# Patient Record
Sex: Female | Born: 1970 | Race: White | Hispanic: No | Marital: Married | Smoking: Former smoker
Health system: Southern US, Community
[De-identification: ages and names within clinical notes are randomized; demographics above are authoritative.]

## PROBLEM LIST (undated history)

## (undated) DIAGNOSIS — Z1509 Genetic susceptibility to other malignant neoplasm: Secondary | ICD-10-CM

## (undated) DIAGNOSIS — Z1501 Genetic susceptibility to malignant neoplasm of breast: Secondary | ICD-10-CM

## (undated) DIAGNOSIS — Z808 Family history of malignant neoplasm of other organs or systems: Secondary | ICD-10-CM

## (undated) DIAGNOSIS — Z806 Family history of leukemia: Secondary | ICD-10-CM

## (undated) DIAGNOSIS — Z803 Family history of malignant neoplasm of breast: Secondary | ICD-10-CM

## (undated) HISTORY — DX: Genetic susceptibility to other malignant neoplasm: Z15.09

## (undated) HISTORY — DX: Family history of malignant neoplasm of breast: Z80.3

## (undated) HISTORY — DX: Genetic susceptibility to malignant neoplasm of breast: Z15.01

## (undated) HISTORY — DX: Family history of malignant neoplasm of other organs or systems: Z80.8

## (undated) HISTORY — DX: Family history of leukemia: Z80.6

---

## 1999-04-17 ENCOUNTER — Ambulatory Visit (HOSPITAL_COMMUNITY): Admission: RE | Admit: 1999-04-17 | Discharge: 1999-04-17 | Payer: Self-pay | Admitting: Obstetrics and Gynecology

## 2000-08-29 ENCOUNTER — Inpatient Hospital Stay (HOSPITAL_COMMUNITY): Admission: AD | Admit: 2000-08-29 | Discharge: 2000-08-29 | Payer: Self-pay | Admitting: Obstetrics and Gynecology

## 2002-03-06 ENCOUNTER — Other Ambulatory Visit: Admission: RE | Admit: 2002-03-06 | Discharge: 2002-03-06 | Payer: Self-pay | Admitting: Obstetrics and Gynecology

## 2002-07-30 ENCOUNTER — Ambulatory Visit (HOSPITAL_COMMUNITY): Admission: RE | Admit: 2002-07-30 | Discharge: 2002-07-30 | Payer: Self-pay | Admitting: Obstetrics and Gynecology

## 2002-07-30 ENCOUNTER — Encounter: Payer: Self-pay | Admitting: Obstetrics and Gynecology

## 2003-09-24 ENCOUNTER — Other Ambulatory Visit: Admission: RE | Admit: 2003-09-24 | Discharge: 2003-09-24 | Payer: Self-pay | Admitting: Obstetrics and Gynecology

## 2005-01-21 ENCOUNTER — Other Ambulatory Visit: Admission: RE | Admit: 2005-01-21 | Discharge: 2005-01-21 | Payer: Self-pay | Admitting: Obstetrics and Gynecology

## 2005-01-22 ENCOUNTER — Other Ambulatory Visit: Admission: RE | Admit: 2005-01-22 | Discharge: 2005-01-22 | Payer: Self-pay | Admitting: Obstetrics and Gynecology

## 2005-06-10 ENCOUNTER — Other Ambulatory Visit: Admission: RE | Admit: 2005-06-10 | Discharge: 2005-06-10 | Payer: Self-pay | Admitting: Obstetrics and Gynecology

## 2009-09-08 ENCOUNTER — Emergency Department (HOSPITAL_COMMUNITY): Admission: EM | Admit: 2009-09-08 | Discharge: 2009-09-08 | Payer: Self-pay | Admitting: Emergency Medicine

## 2017-07-18 ENCOUNTER — Emergency Department (HOSPITAL_COMMUNITY): Payer: Worker's Compensation

## 2017-07-18 ENCOUNTER — Emergency Department (HOSPITAL_COMMUNITY)
Admission: EM | Admit: 2017-07-18 | Discharge: 2017-07-18 | Disposition: A | Discharge status: Home / Self Care | Payer: Worker's Compensation | Attending: Emergency Medicine | Admitting: Emergency Medicine

## 2017-07-18 DIAGNOSIS — R52 Pain, unspecified: Secondary | ICD-10-CM

## 2017-07-18 DIAGNOSIS — Y999 Unspecified external cause status: Secondary | ICD-10-CM | POA: Diagnosis not present

## 2017-07-18 DIAGNOSIS — Y9289 Other specified places as the place of occurrence of the external cause: Secondary | ICD-10-CM | POA: Diagnosis not present

## 2017-07-18 DIAGNOSIS — Y9301 Activity, walking, marching and hiking: Secondary | ICD-10-CM | POA: Diagnosis not present

## 2017-07-18 DIAGNOSIS — R110 Nausea: Secondary | ICD-10-CM | POA: Insufficient documentation

## 2017-07-18 DIAGNOSIS — W1830XA Fall on same level, unspecified, initial encounter: Secondary | ICD-10-CM | POA: Insufficient documentation

## 2017-07-18 DIAGNOSIS — R55 Syncope and collapse: Secondary | ICD-10-CM | POA: Insufficient documentation

## 2017-07-18 DIAGNOSIS — S82851A Displaced trimalleolar fracture of right lower leg, initial encounter for closed fracture: Secondary | ICD-10-CM

## 2017-07-18 DIAGNOSIS — S99911A Unspecified injury of right ankle, initial encounter: Secondary | ICD-10-CM | POA: Diagnosis present

## 2017-07-18 LAB — CBG MONITORING, ED: Glucose-Capillary: 140 mg/dL — ABNORMAL HIGH (ref 65–99)

## 2017-07-18 MED ORDER — SODIUM CHLORIDE 0.9 % IV SOLN
INTRAVENOUS | Status: AC | PRN
  Administered 2017-07-18: 100 mL/h via INTRAVENOUS

## 2017-07-18 MED ORDER — ETOMIDATE 2 MG/ML IV SOLN
10.0000 mg | Freq: Once | INTRAVENOUS | Status: AC
  Administered 2017-07-18: 10 mg via INTRAVENOUS
  Filled 2017-07-18: qty 10

## 2017-07-18 MED ORDER — HYDROMORPHONE HCL 1 MG/ML IJ SOLN
1.0000 mg | Freq: Once | INTRAMUSCULAR | Status: AC
  Administered 2017-07-18: 1 mg via INTRAVENOUS
  Filled 2017-07-18: qty 1

## 2017-07-18 MED ORDER — SODIUM CHLORIDE 0.9 % IV BOLUS (SEPSIS)
500.0000 mL | Freq: Once | INTRAVENOUS | Status: AC
  Administered 2017-07-18: 1000 mL via INTRAVENOUS

## 2017-07-18 MED ORDER — OXYCODONE-ACETAMINOPHEN 5-325 MG PO TABS: 1.0000 | ORAL_TABLET | ORAL | 0 refills | Status: DC | PRN

## 2017-07-18 MED ORDER — HYDROMORPHONE HCL 1 MG/ML IJ SOLN
0.5000 mg | Freq: Once | INTRAMUSCULAR | Status: AC
  Administered 2017-07-18: 0.5 mg via INTRAVENOUS
  Filled 2017-07-18: qty 1

## 2017-07-18 NOTE — ED Notes (Signed)
ED Provider at bedside. 

## 2017-07-18 NOTE — ED Notes (Signed)
PT CONTINUES TO RESPOND APPROPRIATELY WITH THIS WRITER AND FAMILY. VSS, 02 SATS CONTINUE AT 100%. PT CONTINUES TO TOLERATE SEDATION AND PROCEDURE. PT AWARE OF PLAN OF CARE. WAITING POST REDUCTION X-RAY. FAMILY PRESENT AND WILL TAKE PT HOME

## 2017-07-18 NOTE — ED Notes (Signed)
XRAY AT BEDSIDE

## 2017-07-18 NOTE — ED Notes (Signed)
EDP AT BEDSIDE.XRAY RESULTS REVIEWED. PT AND FAMILY AWARE OF XRAY RESULTS. EDP MADE AWARE PT HAS CRUTCHES. PT WOULD PREFER NOT TO HAVE CRUTCHES FROM HERE.EDP OKAY WITH NOT CHARGING PT WITH CRUTCHES. ORTHO JON MADE AWARE.

## 2017-07-18 NOTE — Sedation Documentation (Signed)
ORTHO SURGERY AT BEDSIDE UPDATED ON REDUCTION AND PT CURRENT STATUS

## 2017-07-18 NOTE — ED Provider Notes (Signed)
[facility]-EMERGENCY DEPT Provider Note   CSN: [id number] Arrival date & time: 07/18/17  2841     History   Chief Complaint Chief Complaint  Patient presents with  . Fall  . Ankle Pain  . Dislocation    HPI Pamela Vazquez is a 46 y.o. female.  HPI Patient slipped and fell. Twisted right ankle with deformity. No other injury. After the injury while she was on the ground screaming she did have a syncopal episode. Did not hit her head. No chest or abdominal pain. States there is tingling in the toes. Otherwise healthy. He does not take medicines. No past medical history on file.  There are no active problems to display for this patient.   No past surgical history on file.  OB History    No data available       Home Medications    Prior to Admission medications   Medication Sig Start Date End Date Taking? Authorizing Provider  ibuprofen (ADVIL,MOTRIN) 200 MG tablet Take 400-600 mg by mouth every 6 (six) hours as needed for headache or moderate pain.   Yes [provider]  OVER THE COUNTER MEDICATION Juice plus   Yes [provider]  oxyCODONE-acetaminophen (PERCOCET/ROXICET) 5-325 MG tablet Take 1-2 tablets by mouth every 4 (four) hours as needed for severe pain. 07/18/17   Benjiman Core, MD    Family History No family history on file.  Social History Social History  Substance Use Topics  . Smoking status: Not on file  . Smokeless tobacco: Not on file  . Alcohol use Not on file     Allergies   Penicillins   Review of Systems Review of Systems  Constitutional: Negative for appetite change and fever.  HENT: Negative for congestion.   Respiratory: Negative for cough.   Cardiovascular: Negative for chest pain.  Gastrointestinal: Positive for nausea.  Genitourinary: Negative for flank pain.  Musculoskeletal: Negative for back pain.  Skin: Negative for rash.  Neurological: Positive for syncope.  Hematological: Does not bruise/bleed  easily.  Psychiatric/Behavioral: Negative for confusion.     Physical Exam Updated Vital Signs BP 117/74   Pulse 81   Temp 97.9 F (36.6 C) (Oral)   Resp 16   Ht  (1.727 m)   Wt 81.6 kg (180 lb)   SpO2 100%   BMI 27.37 kg/m   Physical Exam  Constitutional: She appears well-developed.  HENT:  Head: Atraumatic.  Eyes: Pupils are equal, round, and reactive to light.  Neck: Neck supple.  No midline cervical tenderness and painless range of motion.  Cardiovascular: Normal rate.   Pulmonary/Chest: Effort normal.  Abdominal: Soft.  Musculoskeletal: She exhibits tenderness and deformity.  Right ankle deformity. No tenderness over proximal fibula. Foot is rotated laterally and been somewhat externally. Neurovascularly grossly intact over foot. Good capillary refill. Skin intact.  Neurological: She is alert.  Skin: Skin is warm. Capillary refill takes less than 2 seconds.       ED Treatments / Results  Labs (all labs ordered are listed, but only abnormal results are displayed) Labs Reviewed  CBG MONITORING, ED - Abnormal; Notable for the following:       Result Value   Glucose-Capillary 140 (*)    All other components within normal limits    EKG  EKG Interpretation None       Radiology Dg Ankle 2 Views Right  Result Date: 07/18/2017 CLINICAL DATA:  Fall in water this morning. Right ankle deformity. Initial encounter. EXAM:  RIGHT ANKLE - 2 VIEW COMPARISON:  None. FINDINGS: Posterior and lateral ankle dislocation with trimalleolar fracture. The degree of posterior plafond involvement is difficult to establish due to distorted anatomy. The fibular fracture is at and above the level of the ankle joint. No visible soft tissue gas or opaque foreign body. IMPRESSION: Trimalleolar ankle fracture with dislocation. Electronically Signed   By: Marnee Spring M.D.   On: 07/18/2017 10:18   Dg Ankle Complete Right  Result Date: 07/18/2017 CLINICAL DATA:  46 year old female  with ankle fracture post reduction. Subsequent encounter. EXAM: RIGHT ANKLE - COMPLETE 3+ VIEW COMPARISON:  07/18/2017. FINDINGS: Overlying splint/ cast obscures fine osseous and soft tissue detail. Reduction of right trimalleolar fracture - dislocation. Significant improvement of alignment. Ankle mortise with slight widening medial aspect. Slight incongruity of fracture fragments/ posterior distal tibial articular surface. IMPRESSION: Reduction of right trimalleolar fracture - dislocation. Significant improvement of alignment. Ankle mortise with slight widening medial aspect. Slight incongruity of fracture fragments/ posterior distal tibial articular surface (best appreciated on lateral view). Electronically Signed   By: Lacy Duverney M.D.   On: 07/18/2017 12:02    Procedures ORTHOPEDIC INJURY TREATMENT Date/Time: 07/18/2017 12:35 PM Performed by: Benjiman Core Authorized by: Benjiman Core  Consent: Verbal consent obtained. Written consent obtained. Risks and benefits: risks, benefits and alternatives were discussed Consent given by: patient Patient understanding: patient states understanding of the procedure being performed Imaging studies: imaging studies available Required items: required blood products, implants, devices, and special equipment available Patient identity confirmed: verbally with patient, arm band and provided demographic data Time out: Immediately prior to procedure a "time out" was called to verify the correct patient, procedure, equipment, support staff and site/side marked as required. Injury location: ankle Location details: right ankle Injury type: fracture-dislocation Fracture type: trimalleolar Pre-procedure distal perfusion: normal Pre-procedure neurological function: diminished Pre-procedure range of motion: reduced  Anesthesia: Local anesthesia used: no  Sedation: Patient sedated: yes Sedation type: moderate (conscious) sedation Sedatives:  etomidate Analgesia: hydromorphone Manipulation performed: yes Skin traction used: no Skeletal traction used: no Reduction successful: yes X-ray confirmed reduction: yes Immobilization: splint Splint type: short leg and ankle stirrup Supplies used: plaster Post-procedure neurovascular assessment: post-procedure neurovascularly intact Post-procedure distal perfusion: normal Post-procedure neurological function: normal Patient tolerance: Patient tolerated the procedure well with no immediate complications  Sedation procedure Date/Time: 07/18/2017 12:37 PM Performed by: Benjiman Core Authorized by: Benjiman Core   Consent:    Consent obtained:  Verbal and written   Consent given by:  Patient   Risks discussed:  Allergic reaction, prolonged hypoxia resulting in organ damage, prolonged sedation necessitating reversal, respiratory compromise necessitating ventilatory assistance and intubation, dysrhythmia, inadequate sedation and nausea   Alternatives discussed:  Analgesia without sedation Universal protocol:    Imaging studies available: yes     Immediately prior to procedure a time out was called: yes     Patient identity confirmation method:  Arm band, verbally with patient and provided demographic data Indications:    Procedure performed:  Fracture reduction   Procedure necessitating sedation performed by:  Physician performing sedation   Intended level of sedation:  Moderate (conscious sedation) Pre-sedation assessment:    Time since last food or drink:  12 hrs   ASA classification: class 1 - normal, healthy patient     Neck mobility: normal     Mallampati score:  II - soft palate, uvula, fauces visible   Pre-sedation assessments completed and reviewed: airway patency   Immediate pre-procedure details:  Reassessment: Patient reassessed immediately prior to procedure     Reviewed: vital signs     Verified: bag valve mask available, intubation equipment available,  oxygen available and suction available   Procedure details (see MAR for exact dosages):    Preoxygenation:  Nasal cannula   Sedation:  Etomidate   Intra-procedure monitoring:  Blood pressure monitoring, cardiac monitor, continuous pulse oximetry, continuous capnometry, frequent LOC assessments and frequent vital sign checks   Intra-procedure events: none     Total Provider sedation time (minutes):  10   (including critical care time)  Medications Ordered in ED Medications  HYDROmorphone (DILAUDID) injection 0.5 mg (0.5 mg Intravenous Given 07/18/17 0939)  sodium chloride 0.9 % bolus 500 mL (0 mLs Intravenous Stopped 07/18/17 1040)  etomidate (AMIDATE) injection 10 mg (10 mg Intravenous Given 07/18/17 1114)  HYDROmorphone (DILAUDID) injection 1 mg (1 mg Intravenous Given 07/18/17 1031)  0.9 %  sodium chloride infusion (100 mL/hr Intravenous New Bag/Given 07/18/17 1113)     Initial Impression / Assessment and Plan / ED Course  I have reviewed the triage vital signs and the nursing notes.  Pertinent labs & imaging results that were available during my care of the patient were reviewed by me and considered in my medical decision making (see chart for details).     Patient with ankle fracture. Reduced in the ER. Splinted by orthopedic tech with assistance from me. Artie has crutches at home. Discuss with Dr. Eulah Pont, will see the patient in follow-up on Monday morning.    Final Clinical Impressions(s) / ED Diagnoses   Final diagnoses:  Trimalleolar fracture of ankle, closed, right, initial encounter    New Prescriptions New Prescriptions   OXYCODONE-ACETAMINOPHEN (PERCOCET/ROXICET) 5-325 MG TABLET    Take 1-2 tablets by mouth every 4 (four) hours as needed for severe pain.     Benjiman Core, MD 07/18/17 [phone]

## 2017-07-18 NOTE — ED Triage Notes (Signed)
Per [facility]. Pt walking into work and fell slipped on floor. Rt ankle deformity. Pt hyperventilating with event. Syncopal episode followed. Denies LOC, neck or back pain. Fentanyl IVP and zofran  ODT in route. Pain improved nausea the same.Long leg splint placed. Good CMS present. Ice placed

## 2017-07-18 NOTE — Sedation Documentation (Signed)
LONG SPLINT APPLIED BY EDP AND ORTHO TECH JON TO RIGHT ANKLE. PT TOLERATED. GOOD CMS

## 2017-07-18 NOTE — ED Notes (Signed)
Bed: EA54 Expected date:  Expected time:  Means of arrival:  Comments: 46 yo syncopal episode, hypotensive

## 2017-07-18 NOTE — ED Notes (Signed)
Splint with ace intact. Good cms. Pt with no complaints

## 2017-07-18 NOTE — Sedation Documentation (Signed)
REDUCTION COMPLETE TO RIGHT ANKLE

## 2017-07-18 NOTE — Sedation Documentation (Signed)
Family updated as to patient's status. FAMILY PRESENT

## 2018-01-28 IMAGING — CR DG ANKLE 2V *R*
2 series · 2 of 2 positions shown · non-contrast
Comparison: None.

CLINICAL DATA: Fall in water this morning. Right ankle deformity.
Initial encounter.

EXAM:
RIGHT ANKLE - 2 VIEW

[x ankle ap right]
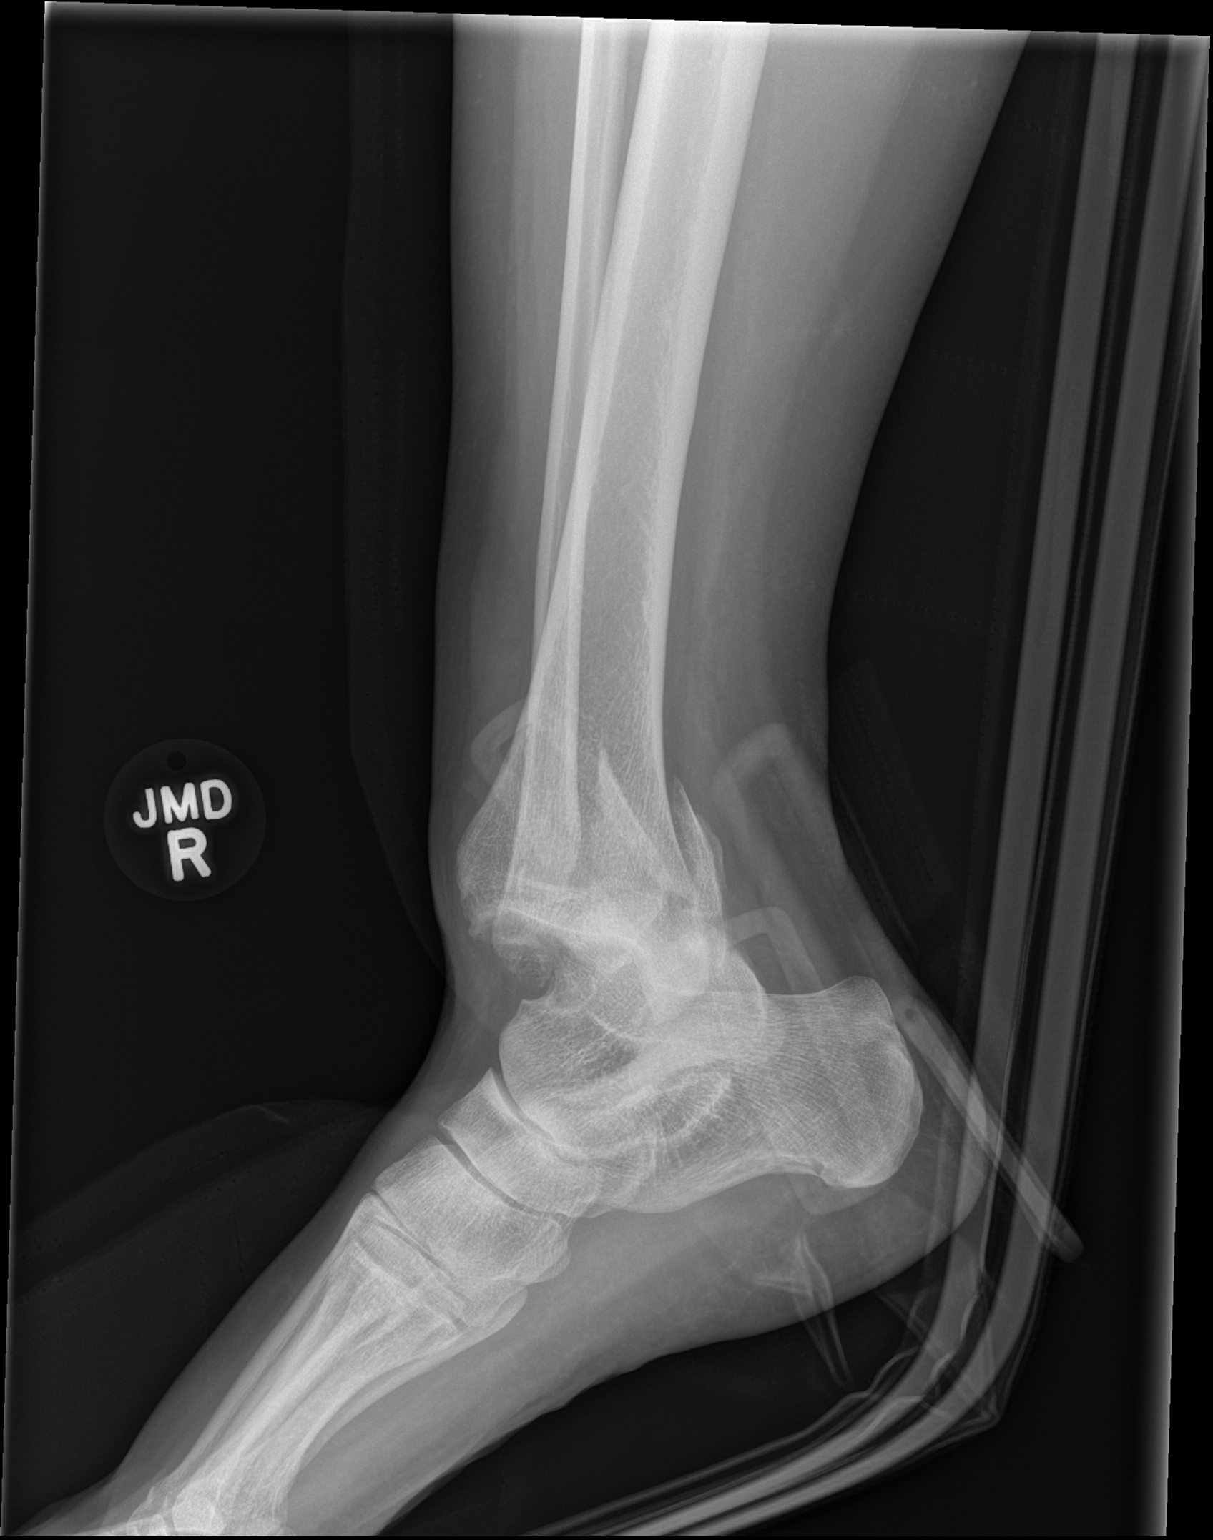

[x ankle lat right]
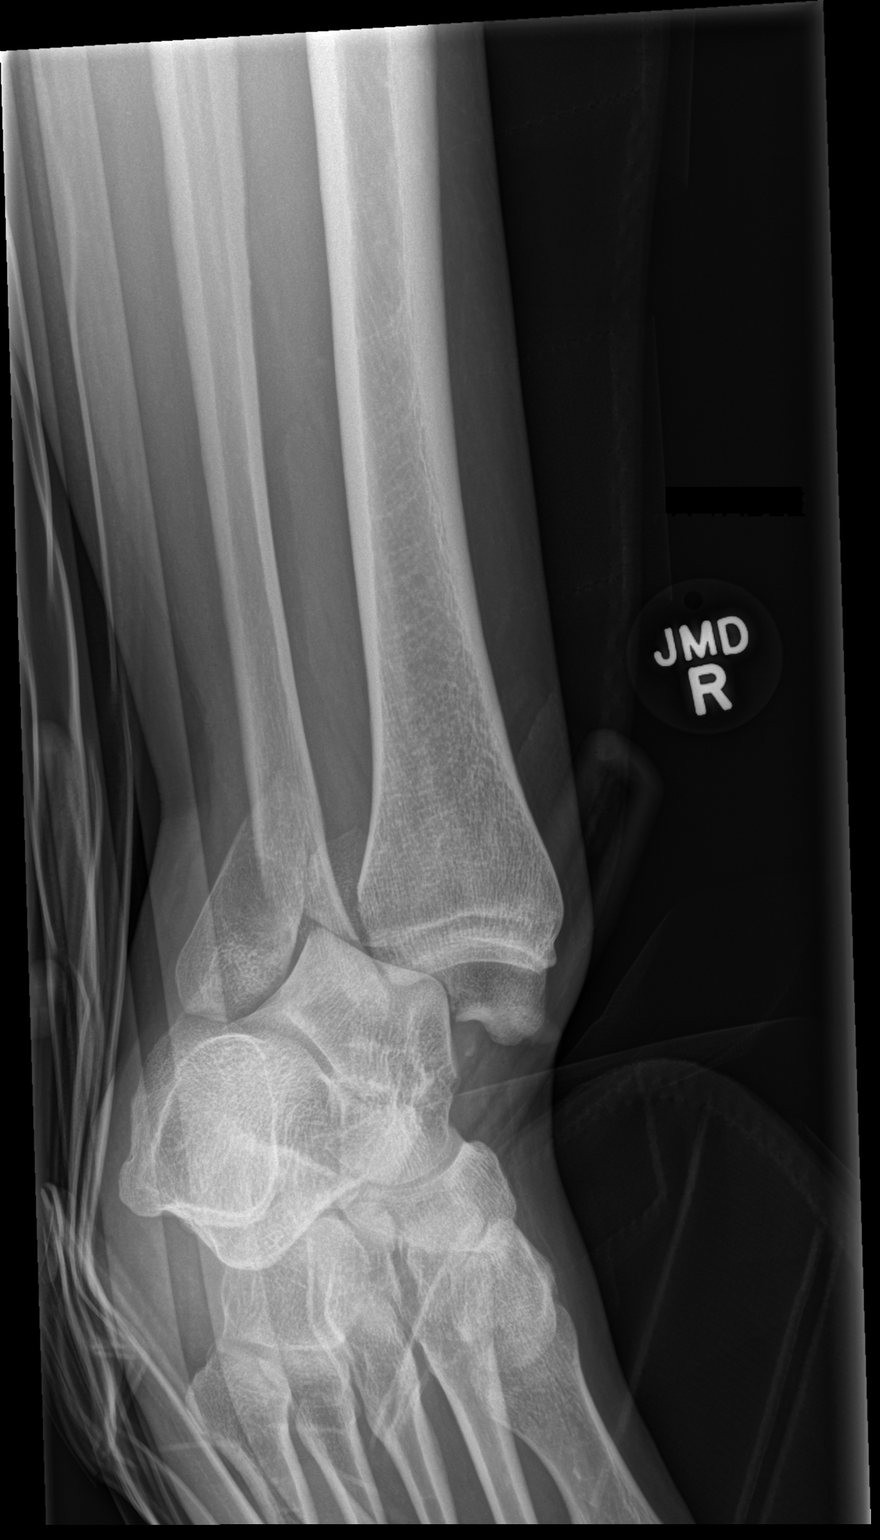

[2 of 2 positions shown; findings below may reference images not displayed]

FINDINGS: Posterior and lateral ankle dislocation with trimalleolar fracture.
The degree of posterior plafond involvement is difficult to
establish due to distorted anatomy. The fibular fracture is at and
above the level of the ankle joint. No visible soft tissue gas or
opaque foreign body.
IMPRESSION: Trimalleolar ankle fracture with dislocation.

## 2018-09-12 ENCOUNTER — Other Ambulatory Visit: Payer: Self-pay | Admitting: Gastroenterology

## 2018-09-12 DIAGNOSIS — R898 Other abnormal findings in specimens from other organs, systems and tissues: Secondary | ICD-10-CM

## 2018-09-18 ENCOUNTER — Encounter: Payer: Self-pay | Admitting: Genetics

## 2018-09-19 ENCOUNTER — Ambulatory Visit
Admission: RE | Admit: 2018-09-19 | Discharge: 2018-09-19 | Disposition: A | Discharge status: Home / Self Care | Payer: 59 | Source: Ambulatory Visit | Attending: Gastroenterology | Admitting: Gastroenterology

## 2018-09-19 DIAGNOSIS — R898 Other abnormal findings in specimens from other organs, systems and tissues: Secondary | ICD-10-CM

## 2018-09-19 MED ORDER — IOPAMIDOL (ISOVUE-300) INJECTION 61%
100.0000 mL | Freq: Once | INTRAVENOUS | Status: AC | PRN
  Administered 2018-09-19: 100 mL via INTRAVENOUS

## 2018-10-10 ENCOUNTER — Inpatient Hospital Stay: Payer: 59

## 2018-10-10 ENCOUNTER — Inpatient Hospital Stay: Payer: 59 | Attending: Genetic Counselor | Admitting: Genetics

## 2018-10-10 DIAGNOSIS — Z808 Family history of malignant neoplasm of other organs or systems: Secondary | ICD-10-CM | POA: Diagnosis not present

## 2018-10-10 DIAGNOSIS — Z1509 Genetic susceptibility to other malignant neoplasm: Secondary | ICD-10-CM | POA: Diagnosis not present

## 2018-10-10 DIAGNOSIS — Z7183 Encounter for nonprocreative genetic counseling: Secondary | ICD-10-CM

## 2018-10-10 DIAGNOSIS — Z803 Family history of malignant neoplasm of breast: Secondary | ICD-10-CM

## 2018-10-10 DIAGNOSIS — Z1501 Genetic susceptibility to malignant neoplasm of breast: Secondary | ICD-10-CM

## 2018-10-10 DIAGNOSIS — Z806 Family history of leukemia: Secondary | ICD-10-CM

## 2018-10-11 ENCOUNTER — Encounter: Payer: Self-pay | Admitting: Genetics

## 2018-10-11 DIAGNOSIS — Z1501 Genetic susceptibility to malignant neoplasm of breast: Secondary | ICD-10-CM

## 2018-10-11 DIAGNOSIS — Z1509 Genetic susceptibility to other malignant neoplasm: Secondary | ICD-10-CM

## 2018-10-11 DIAGNOSIS — Z808 Family history of malignant neoplasm of other organs or systems: Secondary | ICD-10-CM

## 2018-10-11 DIAGNOSIS — Z806 Family history of leukemia: Secondary | ICD-10-CM | POA: Insufficient documentation

## 2018-10-11 DIAGNOSIS — Z803 Family history of malignant neoplasm of breast: Secondary | ICD-10-CM | POA: Insufficient documentation

## 2018-10-11 HISTORY — DX: Genetic susceptibility to malignant neoplasm of breast: Z15.01

## 2018-10-11 HISTORY — DX: Family history of malignant neoplasm of other organs or systems: Z80.8

## 2018-10-11 NOTE — Progress Notes (Addendum)
REFERRING PROVIDER: Wonda Horner, MD [phone] [address] [place], [place] [address]  PRIMARY PROVIDER:  Alroy Dust, L.Marlou Sa, MD  PRIMARY REASON FOR VISIT:  1. Family history of breast cancer   2. Family history of leukemia   3. Monoallelic mutation of TOIZ1I gene   4. Family history of melanoma     HISTORY OF PRESENT ILLNESS:   Pamela Vazquez, a 47 y.o. female, was seen for a  cancer genetics consultation at the request of Dr. 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  She recently had an abdominal CT to screen for pancreatic cancer that was  Negative.  Pamela Vazquez is a 47 y.o. female with no personal history of cancer.  She had genetic testing performed by Dr. Rogue Bussing in April 2019.  She had the [facility] panel from [facility].  The [facility] gene panel offered by [employer] includes sequencing and deletion/duplication testing of the following 35 genes: APC, ATM, BARD1, BMPR1A, BRCA1, BRCA2, BRIP1, CHD1, CDK4, CDKN2A, CHEK2, EPCAM (large rearrangement only), GREM1, HOXB13, AXIN2, MSH3, NTHL1, RNF43, GALNT12, RSP20, MLH1, MSH2, MSH6, MUTYH, NBN, PALB2, PMS2, PTEN, RAD51C, RAD51D, SMAD4, STK11, and TP53. Sequencing was performed for select regions of POLE and POLD1, and large rearrangement analysis was performed for select regions of GREM1.  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    She also had 2 variants of uncertain significance identified in the genes CDKN2A c.344G>T (p.Arg115Leu) and RAD51D c.49A>G (p.Ile17Val).    Regarding the VUS's in [place] and RAD51D:At this time, it is unknown if these variants are associated with increased cancer risk or if they are normal findings, but most variants such as this get reclassified to being inconsequential. They should not be used to make medical management decisions.With time, we suspect  the lab will determine the significance of these variants, if any. If they are ever reclassified, her ordering provider Dr. Rogue Bussing will be notified. We recommended she follow-up with Dr. Elvis Coil office to determine how any new information about variants will be communicated.   HORMONAL RISK FACTORS:  Menarche was at age 60.  First live birth at age 89.  Ovaries intact: yes.  Hysterectomy: no.  Menopausal status: premenopausal.  HRT use: 0 years. Colonoscopy: no; not examined. Mammogram within the last year: yes. Number of breast biopsies: 0.  Past Medical History:  Diagnosis Date  . Family history of breast cancer   . Family history of leukemia   . Family history of melanoma 10/11/2018  . Monoallelic mutation of WPYK9X gene 10/11/2018     Social History   Socioeconomic History  . Marital status: Married    Spouse name: Not on file  . Number of children: Not on file  . Years of education: Not on file  . Highest education level: Not on file  Occupational History  . Not on file  Social Needs  . Financial resource strain: Not on file  . Food insecurity:    Worry: Not on file    Inability: Not on file  . Transportation needs:    Medical: Not on file    Non-medical: Not on file  Tobacco Use  . Smoking status: Not on file  Substance and Sexual Activity  . Alcohol use: Not on file  . Drug use: Not on file  . Sexual activity: Not on file  Lifestyle  . Physical activity:  Days per week: Not on file    Minutes per session: Not on file  . Stress: Not on file  Relationships  . Social connections:    Talks on phone: Not on file    Gets together: Not on file    Attends religious service: Not on file    Active member of club or organization: Not on file    Attends meetings of clubs or organizations: Not on file    Relationship status: Not on file  Other Topics Concern  . Not on file  Social History Narrative  . Not on file     FAMILY HISTORY:  We obtained a  detailed, 4-generation family history.  Significant diagnoses are listed below: Family History  Problem Relation Age of Onset  . Breast cancer Mother 25  . Leukemia Maternal Grandmother   . Breast cancer Paternal Grandmother 40  . Melanoma Paternal Grandfather 53       metastaitc, died from it   Pamela Vazquez has a 36 year-old son and a 19 year-old daughter with no hx of cancer.  She has 1 granddaughter who is 1.   Pamela Vazquez has no siblings.   Pamela Vazquez father: 55, no hx of cancer Paternal Aunts/Uncles: 1 paternal aunt, Loletha Carrow, in her late 21's with no hx of cancer.  Paternal cousins: 45 female cousin is 71 with no hx of cancer.  Paternal grandfather: died of metastatic melanoma in his 34's Paternal grandmother:died of breast cancer at 16, dx in 19's  Pamela Vazquez's mother: breast cancer dx at 69, now is 75.  Maternal Aunts/Uncles: 1 maternal aunt is in her 66's with no hx of cancer.  Maternal cousins: 73 female cousin in her 6's, no hx of cancer.  Maternal grandfather: died at 29 with no hx of cancer.  Maternal grandmother:died in her 80,s had hx of leukemia  Pamela Vazquez is unaware of previous family history of genetic testing for hereditary cancer risks. Patient's maternal ancestors are of N. European descent, and paternal ancestors are of N. European descent. There is no reported Ashkenazi Jewish ancestry. There is no known consanguinity.  GENETIC COUNSELING ASSESSMENT: Pamela Vazquez is a 47 y.o. female with a personal history of a CDKN2A mutation.  We, therefore, discussed and recommended the following at today's visit.   CDKN2A/Familial Atypical Multiple Mole Melanoma Syndrome: Pathogenic variants in the CDKN2A gene cause a condition called Familial atypical multiple mole melanoma syndrome (FAMMM syndrome).  Individuals with this syndrome typically present with multiple atypical moles, increased risk for melanoma, and increased risk for pancreatic cancer. The lifetime  risk for melanoma is estimated to be between 58% to 92%.  The lifetime risk for pancreatic has been estimated to be about 17% by age 100, but this risk appears to vary depending on the specific mutation, and has been reported in the literature to be anywhere from 11%-60%. Other factors can also contribute to a person's pancreatic cancer risk including smoking, chronic pancreatitis, etc.   This condition is inherited in an autosomal dominant pattern.    Surveillance for these individuals inclues:  Melanoma: routine skin exams by a dermatologist, and monthly self skin exams.  Sun protection including the use of sun screening, long sleeves, hats, avoiding peak hrs 11-2pm, etc are also recommended.   Pancreatic Cancer: Screening for pancreatic cancer is still being studied/developed.  However, current suggestions from NCCN 1.2020 recommended pancreatic cancer screening for individuals with a pathogenic variant in CDKN2A and a family history of pancreatic  cancer (1st or 2nd degree relative) from the side of the family the mutation is inherited.   Pamela Vazquez does not report any family history of pancreatic cancer.  She does however, report a personal history of smoking.  She reports smoking on and off for 20 years and quit 15 years ago.    If pancreatic screening is performed, it is recommended to start at age 15 (or 83 years younger than the earliest pancreatic cancer dx in the family, whichever is first).   Pancreatic cancer screening typically includes contrast-enhanced MRI  Cholangiopancreatography (MRCP) and/or Endoscopic Ultrasound.  Sometimes these are alternated each year.   We discussed that the benefits of pancreatic cancer screening are still being studied.  Current data suggests downstaging/increased resectability of pancreatic cancers detected with screening vs. symptoms. However, there is still limited data to confirm a survival advantage for screening at this point.   Given the still  evolving/emerging guidelines and data regarding pancreatic cancer screening, we recommend Pamela Vazquez consider all of these points and discuss her screening plan with her GI doctor.  The course of action is cases by case and her individual family history and personal risk factors should be considered.  There is also an oncologist who can see Pamela Vazquez in high risk clinic that could discuss the option of pancreatic cancer screening as well if desired.  Pamela Vazquez plans to follow-up with her GI provider at this time.  We also discussed the signs and symptoms of pancreatic cancer to be aware of and to seek prompt medical evaluation if she ever notices these.   Breast Cancer Risk: We used the clinical information and family history to run [employer].  This provides an estimate of a patient's lifetime breast cancer risk.  This estimates her lifetime risk to be about 18.6%.  Somewhat elevated above the average woman's risk, but not above 20%.  20%+ is considered high risk and when high risk screening is considered.     FAMILY MEMBERS: Each of Pamela Vazquez's children have at 50% chance to have inherited this CDKN2A mutation.  We recommend they all have genetic testing to determine if they carry the familial CDKN2A mutation.  Skin examinations are recommended to begin at the age of 41.  We recommended her children get tested as they would then need to start skin exams, if they choose not to be tested at this time we would still recommend they start routine skin exams by a dermatologist.   All other paternal relatives (aunts/uncles/cousins, etc) are also recommended to have genetic testing to determine if they carry the paternal family CDKN2A pathogenic variant.  If we can be of any assistance in coordinating genetic testing for any of these relatives, we encouraged Pamela Vazquez to have them contact us.  We also recommended her mother have genetic testing because there could still be a genetic cause for  her cancer that Pamela Vazquez did not inherit and therefore would not be found in her testing.   PLAN:   1. Pamela Vazquez states she is currently scheduled to establish care with a dermatologist and have a full body skin exam in Jan 2020.  She said she would contact us to let us know the name of her dermatologist and we can forward her results at that time.   2. Pamela Vazquez will follow-up with Dr. Jilda Roche regarding pancreatic cancer screening.  She understands the options and the considerations that go into deciding a screening plan (family hx, personal risk  factors, still emerging data, etc).  3. Ms. Gatliff will discuss this result with her children and relatives and encourage them to be tested.  She will let us know if we can be of any assistance in this process.   Lastly, we encouraged Ms. Depp to remain in contact with cancer genetics annually so that we can continuously update the family history and inform her of any changes in cancer genetics and testing that may be of benefit for this family.   Ms.  Zale questions were answered to her satisfaction today. Our contact information was provided should additional questions or concerns arise. Thank you for the referral and allowing [place] to share in the care of your patient.   Tana Felts, MS, [facility] Certified Genetic Counselor [email or web address] phone: [phone]  The patient was seen for a total of 35 minutes in face-to-face genetic counseling.  This patient was discussed with Drs. Magrinat, Lindi Adie and/or Burr Medico who agrees with the above.

## 2021-06-11 DIAGNOSIS — Z1211 Encounter for screening for malignant neoplasm of colon: Secondary | ICD-10-CM | POA: Diagnosis not present

## 2021-08-03 DIAGNOSIS — D225 Melanocytic nevi of trunk: Secondary | ICD-10-CM | POA: Diagnosis not present

## 2021-08-03 DIAGNOSIS — L821 Other seborrheic keratosis: Secondary | ICD-10-CM | POA: Diagnosis not present

## 2021-08-03 DIAGNOSIS — L814 Other melanin hyperpigmentation: Secondary | ICD-10-CM | POA: Diagnosis not present

## 2021-08-03 DIAGNOSIS — Z23 Encounter for immunization: Secondary | ICD-10-CM | POA: Diagnosis not present

## 2021-08-03 DIAGNOSIS — Z86006 Personal history of melanoma in-situ: Secondary | ICD-10-CM | POA: Diagnosis not present

## 2022-02-16 DIAGNOSIS — E7800 Pure hypercholesterolemia, unspecified: Secondary | ICD-10-CM | POA: Diagnosis not present

## 2022-02-16 DIAGNOSIS — J309 Allergic rhinitis, unspecified: Secondary | ICD-10-CM | POA: Diagnosis not present

## 2022-02-16 DIAGNOSIS — Z0000 Encounter for general adult medical examination without abnormal findings: Secondary | ICD-10-CM | POA: Diagnosis not present

## 2022-03-04 DIAGNOSIS — Z1231 Encounter for screening mammogram for malignant neoplasm of breast: Secondary | ICD-10-CM | POA: Diagnosis not present

## 2022-03-30 DIAGNOSIS — L578 Other skin changes due to chronic exposure to nonionizing radiation: Secondary | ICD-10-CM | POA: Diagnosis not present

## 2022-03-30 DIAGNOSIS — L814 Other melanin hyperpigmentation: Secondary | ICD-10-CM | POA: Diagnosis not present

## 2022-03-30 DIAGNOSIS — D485 Neoplasm of uncertain behavior of skin: Secondary | ICD-10-CM | POA: Diagnosis not present

## 2022-03-30 DIAGNOSIS — D225 Melanocytic nevi of trunk: Secondary | ICD-10-CM | POA: Diagnosis not present

## 2022-04-13 DIAGNOSIS — E7800 Pure hypercholesterolemia, unspecified: Secondary | ICD-10-CM | POA: Diagnosis not present

## 2022-05-21 DIAGNOSIS — Z01419 Encounter for gynecological examination (general) (routine) without abnormal findings: Secondary | ICD-10-CM | POA: Diagnosis not present

## 2022-10-05 DIAGNOSIS — D225 Melanocytic nevi of trunk: Secondary | ICD-10-CM | POA: Diagnosis not present

## 2022-10-05 DIAGNOSIS — L814 Other melanin hyperpigmentation: Secondary | ICD-10-CM | POA: Diagnosis not present

## 2022-10-05 DIAGNOSIS — D485 Neoplasm of uncertain behavior of skin: Secondary | ICD-10-CM | POA: Diagnosis not present

## 2022-10-05 DIAGNOSIS — L578 Other skin changes due to chronic exposure to nonionizing radiation: Secondary | ICD-10-CM | POA: Diagnosis not present

## 2022-10-05 DIAGNOSIS — D0362 Melanoma in situ of left upper limb, including shoulder: Secondary | ICD-10-CM | POA: Diagnosis not present

## 2022-10-05 DIAGNOSIS — Z86006 Personal history of melanoma in-situ: Secondary | ICD-10-CM | POA: Diagnosis not present

## 2022-10-14 DIAGNOSIS — D0362 Melanoma in situ of left upper limb, including shoulder: Secondary | ICD-10-CM | POA: Diagnosis not present

## 2022-12-07 DIAGNOSIS — Z1329 Encounter for screening for other suspected endocrine disorder: Secondary | ICD-10-CM | POA: Diagnosis not present

## 2022-12-07 DIAGNOSIS — E7800 Pure hypercholesterolemia, unspecified: Secondary | ICD-10-CM | POA: Diagnosis not present

## 2022-12-07 DIAGNOSIS — Z131 Encounter for screening for diabetes mellitus: Secondary | ICD-10-CM | POA: Diagnosis not present

## 2022-12-07 DIAGNOSIS — Z130 Encounter for screening for diseases of the blood and blood-forming organs and certain disorders involving the immune mechanism: Secondary | ICD-10-CM | POA: Diagnosis not present

## 2022-12-07 DIAGNOSIS — E663 Overweight: Secondary | ICD-10-CM | POA: Diagnosis not present

## 2022-12-07 DIAGNOSIS — N951 Menopausal and female climacteric states: Secondary | ICD-10-CM | POA: Diagnosis not present

## 2022-12-14 DIAGNOSIS — Z1331 Encounter for screening for depression: Secondary | ICD-10-CM | POA: Diagnosis not present

## 2022-12-14 DIAGNOSIS — Z1339 Encounter for screening examination for other mental health and behavioral disorders: Secondary | ICD-10-CM | POA: Diagnosis not present

## 2022-12-14 DIAGNOSIS — E7800 Pure hypercholesterolemia, unspecified: Secondary | ICD-10-CM | POA: Diagnosis not present

## 2022-12-14 DIAGNOSIS — R635 Abnormal weight gain: Secondary | ICD-10-CM | POA: Diagnosis not present

## 2022-12-14 DIAGNOSIS — N951 Menopausal and female climacteric states: Secondary | ICD-10-CM | POA: Diagnosis not present

## 2022-12-20 DIAGNOSIS — E7800 Pure hypercholesterolemia, unspecified: Secondary | ICD-10-CM | POA: Diagnosis not present

## 2022-12-20 DIAGNOSIS — Z6830 Body mass index (BMI) 30.0-30.9, adult: Secondary | ICD-10-CM | POA: Diagnosis not present

## 2022-12-27 DIAGNOSIS — E7800 Pure hypercholesterolemia, unspecified: Secondary | ICD-10-CM | POA: Diagnosis not present

## 2022-12-27 DIAGNOSIS — Z6829 Body mass index (BMI) 29.0-29.9, adult: Secondary | ICD-10-CM | POA: Diagnosis not present

## 2023-01-03 DIAGNOSIS — E7800 Pure hypercholesterolemia, unspecified: Secondary | ICD-10-CM | POA: Diagnosis not present

## 2023-01-03 DIAGNOSIS — Z6829 Body mass index (BMI) 29.0-29.9, adult: Secondary | ICD-10-CM | POA: Diagnosis not present

## 2023-01-10 DIAGNOSIS — E7800 Pure hypercholesterolemia, unspecified: Secondary | ICD-10-CM | POA: Diagnosis not present

## 2023-01-10 DIAGNOSIS — Z6829 Body mass index (BMI) 29.0-29.9, adult: Secondary | ICD-10-CM | POA: Diagnosis not present

## 2023-01-12 ENCOUNTER — Encounter: Payer: Self-pay | Admitting: Podiatry

## 2023-01-12 ENCOUNTER — Ambulatory Visit: Payer: BC Managed Care – PPO

## 2023-01-12 ENCOUNTER — Ambulatory Visit (INDEPENDENT_AMBULATORY_CARE_PROVIDER_SITE_OTHER): Payer: BC Managed Care – PPO

## 2023-01-12 ENCOUNTER — Ambulatory Visit: Payer: BC Managed Care – PPO | Admitting: Podiatry

## 2023-01-12 DIAGNOSIS — M722 Plantar fascial fibromatosis: Secondary | ICD-10-CM

## 2023-01-12 DIAGNOSIS — M79671 Pain in right foot: Secondary | ICD-10-CM

## 2023-01-12 MED ORDER — DICLOFENAC SODIUM 75 MG PO TBEC: 75.0000 mg | DELAYED_RELEASE_TABLET | Freq: Two times a day (BID) | ORAL | 2 refills | Status: AC

## 2023-01-12 MED ORDER — TRIAMCINOLONE ACETONIDE 10 MG/ML IJ SUSP
10.0000 mg | Freq: Once | INTRAMUSCULAR | Status: AC
  Administered 2023-01-12: 10 mg

## 2023-01-12 NOTE — Patient Instructions (Signed)

## 2023-01-12 NOTE — Progress Notes (Signed)
Subjective:   Patient ID: Pamela Vazquez, female   DOB: 52 y.o.   MRN: [id number]   HPI Patient presents with 53-monthhistory of pain in the right heel that has been very sore with walking or when getting up.  Did have a fractured ankle with repair 5 years ago that does not quite ever recover completely and patient does not smoke likes to be active   Review of Systems  All other systems reviewed and are negative.       Objective:  Physical Exam Vitals and nursing note reviewed.  Constitutional:      Appearance: She is well-developed.  Pulmonary:     Effort: Pulmonary effort is normal.  Musculoskeletal:        General: Normal range of motion.  Skin:    General: Skin is warm.  Neurological:     Mental Status: She is alert.     Neurovascular status found to be intact muscle strength found to be adequate range of motion adequate exquisite discomfort medial fascial band right at insertion tendon calcaneus with patient has not been able to be as active as she wants due to discomfort.  Patient has mild swelling lateral ankle secondary to previous ankle surgery but no indications of ankle arthritis.  Good digital perfusion well-oriented x 3     Assessment:  Acute Planter fasciitis right history of ankle fracture which may contribute to instability     Plan:  H&P x-rays reviewed and went ahead today did sterile prep and injected the plantar fascia right 3 mg Kenalog 5 mg Xylocaine and applied fascial brace to lift up the arch along with oral medication and instructions for stretching exercises.  Patient to be seen back 2 weeks may require orthotics or other treatment  X-rays indicate spur formation no indication stress fracture arthritis with fixation in place right fibula

## 2023-01-13 ENCOUNTER — Other Ambulatory Visit: Payer: Self-pay | Admitting: Podiatry

## 2023-01-13 DIAGNOSIS — M722 Plantar fascial fibromatosis: Secondary | ICD-10-CM

## 2023-01-13 DIAGNOSIS — M79671 Pain in right foot: Secondary | ICD-10-CM

## 2023-01-17 DIAGNOSIS — E7800 Pure hypercholesterolemia, unspecified: Secondary | ICD-10-CM | POA: Diagnosis not present

## 2023-01-17 DIAGNOSIS — Z6829 Body mass index (BMI) 29.0-29.9, adult: Secondary | ICD-10-CM | POA: Diagnosis not present

## 2023-01-24 DIAGNOSIS — Z6828 Body mass index (BMI) 28.0-28.9, adult: Secondary | ICD-10-CM | POA: Diagnosis not present

## 2023-01-24 DIAGNOSIS — E7800 Pure hypercholesterolemia, unspecified: Secondary | ICD-10-CM | POA: Diagnosis not present

## 2023-01-26 ENCOUNTER — Encounter: Payer: Self-pay | Admitting: Podiatry

## 2023-01-26 ENCOUNTER — Ambulatory Visit: Payer: BC Managed Care – PPO | Admitting: Podiatry

## 2023-01-26 DIAGNOSIS — M722 Plantar fascial fibromatosis: Secondary | ICD-10-CM

## 2023-01-26 NOTE — Progress Notes (Signed)
Subjective:   Patient ID: Pamela Vazquez, female   DOB: 52 y.o.   MRN: [id number]   HPI Patient states doing much better with her right heel with significant reduction of the pain   ROS      Objective:  Physical Exam  Neurovascular status intact with reduced inflammation of the right plantar heel of a minimal nature no pain when I palpated the area     Assessment:  Acute fasciitis right doing much better with conservative treatment     Plan:  Reviewed at great length the etiology of this discussed continued stretching exercises anti-inflammatories shoe gear modification and possibility of orthotics or other treatment depending on how symptoms do.  Reappoint to recheck

## 2023-02-01 DIAGNOSIS — Z6828 Body mass index (BMI) 28.0-28.9, adult: Secondary | ICD-10-CM | POA: Diagnosis not present

## 2023-02-01 DIAGNOSIS — E7800 Pure hypercholesterolemia, unspecified: Secondary | ICD-10-CM | POA: Diagnosis not present

## 2023-02-08 DIAGNOSIS — E7800 Pure hypercholesterolemia, unspecified: Secondary | ICD-10-CM | POA: Diagnosis not present

## 2023-02-08 DIAGNOSIS — Z6828 Body mass index (BMI) 28.0-28.9, adult: Secondary | ICD-10-CM | POA: Diagnosis not present

## 2023-02-16 DIAGNOSIS — Z6828 Body mass index (BMI) 28.0-28.9, adult: Secondary | ICD-10-CM | POA: Diagnosis not present

## 2023-02-16 DIAGNOSIS — E7800 Pure hypercholesterolemia, unspecified: Secondary | ICD-10-CM | POA: Diagnosis not present

## 2023-02-24 DIAGNOSIS — Z0000 Encounter for general adult medical examination without abnormal findings: Secondary | ICD-10-CM | POA: Diagnosis not present

## 2023-02-24 DIAGNOSIS — E7800 Pure hypercholesterolemia, unspecified: Secondary | ICD-10-CM | POA: Diagnosis not present

## 2023-03-08 DIAGNOSIS — E7800 Pure hypercholesterolemia, unspecified: Secondary | ICD-10-CM | POA: Diagnosis not present

## 2023-03-08 DIAGNOSIS — Z6828 Body mass index (BMI) 28.0-28.9, adult: Secondary | ICD-10-CM | POA: Diagnosis not present

## 2023-03-10 DIAGNOSIS — Z1231 Encounter for screening mammogram for malignant neoplasm of breast: Secondary | ICD-10-CM | POA: Diagnosis not present

## 2023-03-14 DIAGNOSIS — E7800 Pure hypercholesterolemia, unspecified: Secondary | ICD-10-CM | POA: Diagnosis not present

## 2023-03-14 DIAGNOSIS — Z6827 Body mass index (BMI) 27.0-27.9, adult: Secondary | ICD-10-CM | POA: Diagnosis not present

## 2023-04-13 DIAGNOSIS — L578 Other skin changes due to chronic exposure to nonionizing radiation: Secondary | ICD-10-CM | POA: Diagnosis not present

## 2023-04-13 DIAGNOSIS — L814 Other melanin hyperpigmentation: Secondary | ICD-10-CM | POA: Diagnosis not present

## 2023-04-13 DIAGNOSIS — Z86006 Personal history of melanoma in-situ: Secondary | ICD-10-CM | POA: Diagnosis not present

## 2023-04-13 DIAGNOSIS — D225 Melanocytic nevi of trunk: Secondary | ICD-10-CM | POA: Diagnosis not present

## 2023-06-06 DIAGNOSIS — Z6829 Body mass index (BMI) 29.0-29.9, adult: Secondary | ICD-10-CM | POA: Diagnosis not present

## 2023-06-06 DIAGNOSIS — Z01419 Encounter for gynecological examination (general) (routine) without abnormal findings: Secondary | ICD-10-CM | POA: Diagnosis not present

## 2023-10-31 DIAGNOSIS — D225 Melanocytic nevi of trunk: Secondary | ICD-10-CM | POA: Diagnosis not present

## 2023-10-31 DIAGNOSIS — Z86006 Personal history of melanoma in-situ: Secondary | ICD-10-CM | POA: Diagnosis not present

## 2023-10-31 DIAGNOSIS — L814 Other melanin hyperpigmentation: Secondary | ICD-10-CM | POA: Diagnosis not present

## 2023-10-31 DIAGNOSIS — L578 Other skin changes due to chronic exposure to nonionizing radiation: Secondary | ICD-10-CM | POA: Diagnosis not present

## 2024-02-24 DIAGNOSIS — Z1159 Encounter for screening for other viral diseases: Secondary | ICD-10-CM | POA: Diagnosis not present

## 2024-02-24 DIAGNOSIS — E7800 Pure hypercholesterolemia, unspecified: Secondary | ICD-10-CM | POA: Diagnosis not present

## 2024-02-24 DIAGNOSIS — Z130 Encounter for screening for diseases of the blood and blood-forming organs and certain disorders involving the immune mechanism: Secondary | ICD-10-CM | POA: Diagnosis not present

## 2024-02-28 DIAGNOSIS — Z1239 Encounter for other screening for malignant neoplasm of breast: Secondary | ICD-10-CM | POA: Diagnosis not present

## 2024-02-28 DIAGNOSIS — Z130 Encounter for screening for diseases of the blood and blood-forming organs and certain disorders involving the immune mechanism: Secondary | ICD-10-CM | POA: Diagnosis not present

## 2024-02-28 DIAGNOSIS — E7800 Pure hypercholesterolemia, unspecified: Secondary | ICD-10-CM | POA: Diagnosis not present

## 2024-02-28 DIAGNOSIS — Z1159 Encounter for screening for other viral diseases: Secondary | ICD-10-CM | POA: Diagnosis not present

## 2024-02-28 DIAGNOSIS — Z0189 Encounter for other specified special examinations: Secondary | ICD-10-CM | POA: Diagnosis not present

## 2024-03-15 DIAGNOSIS — Z1231 Encounter for screening mammogram for malignant neoplasm of breast: Secondary | ICD-10-CM | POA: Diagnosis not present

## 2024-05-10 DIAGNOSIS — D485 Neoplasm of uncertain behavior of skin: Secondary | ICD-10-CM | POA: Diagnosis not present

## 2024-05-10 DIAGNOSIS — L814 Other melanin hyperpigmentation: Secondary | ICD-10-CM | POA: Diagnosis not present

## 2024-05-10 DIAGNOSIS — Z86006 Personal history of melanoma in-situ: Secondary | ICD-10-CM | POA: Diagnosis not present

## 2024-05-10 DIAGNOSIS — D225 Melanocytic nevi of trunk: Secondary | ICD-10-CM | POA: Diagnosis not present

## 2024-05-10 DIAGNOSIS — L578 Other skin changes due to chronic exposure to nonionizing radiation: Secondary | ICD-10-CM | POA: Diagnosis not present

## 2024-06-07 DIAGNOSIS — Z01419 Encounter for gynecological examination (general) (routine) without abnormal findings: Secondary | ICD-10-CM | POA: Diagnosis not present
